# Patient Record
Sex: Male | Born: 1990 | Hispanic: No | Marital: Single | Smoking: Current every day smoker
Health system: Southern US, Community
[De-identification: ages and names within clinical notes are randomized; demographics above are authoritative.]

---

## 2020-09-01 ENCOUNTER — Other Ambulatory Visit: Payer: Self-pay

## 2020-09-01 ENCOUNTER — Encounter (HOSPITAL_COMMUNITY): Payer: Self-pay

## 2020-09-01 ENCOUNTER — Ambulatory Visit (HOSPITAL_COMMUNITY)
Admission: EM | Admit: 2020-09-01 | Discharge: 2020-09-01 | Disposition: A | Discharge status: Home / Self Care | Payer: Medicare Other | Attending: Family Medicine | Admitting: Family Medicine

## 2020-09-01 DIAGNOSIS — R067 Sneezing: Secondary | ICD-10-CM | POA: Insufficient documentation

## 2020-09-01 DIAGNOSIS — B349 Viral infection, unspecified: Secondary | ICD-10-CM | POA: Insufficient documentation

## 2020-09-01 DIAGNOSIS — R059 Cough, unspecified: Secondary | ICD-10-CM | POA: Diagnosis not present

## 2020-09-01 DIAGNOSIS — Z1152 Encounter for screening for COVID-19: Secondary | ICD-10-CM | POA: Insufficient documentation

## 2020-09-01 LAB — SARS CORONAVIRUS 2 (TAT 6-24 HRS): SARS Coronavirus 2: NEGATIVE

## 2020-09-01 MED ORDER — CETIRIZINE-PSEUDOEPHEDRINE ER 5-120 MG PO TB12: 1.0000 | ORAL_TABLET | Freq: Every day | ORAL | 0 refills | Status: AC

## 2020-09-01 MED ORDER — FLUTICASONE PROPIONATE 50 MCG/ACT NA SUSP: 2.0000 | Freq: Every day | NASAL | 0 refills | Status: AC

## 2020-09-01 NOTE — Discharge Instructions (Addendum)
Use zyrtec D for congestion and sneezing  Use 2 sprays of flonase per nostril twice a day  Your COVID test is pending.  You should self quarantine until the test result is back.    Take Tylenol as needed for fever or discomfort.  Rest and keep yourself hydrated.    Go to the emergency department if you develop acute worsening symptoms.

## 2020-09-01 NOTE — ED Triage Notes (Signed)
Pt presents with non productive cough and sneezing today.

## 2020-09-03 NOTE — ED Provider Notes (Signed)
[facility]   [id number] 09/01/20 Arrival Time: 1740   CC: COVID symptoms  SUBJECTIVE: History from: patient.  Jeremy Graves is a 29 y.o. male who presents with abrupt onset of nasal congestion, PND, sneezing and cough today. Denies sick exposure to COVID, flu or strep. Denies recent travel. Has negative history of Covid. Has not completed Covid vaccines. Has not taken OTC medications for this. There are no aggravating or alleviating factors. Denies previous symptoms in the past. Denies fever, chills, fatigue, sinus pain, rhinorrhea, sore throat, SOB, wheezing, chest pain, nausea, changes in bowel or bladder habits.    ROS: As per HPI.  All other pertinent ROS negative.     History reviewed. No pertinent past medical history. History reviewed. No pertinent surgical history. Allergies  Allergen Reactions   Latex    No current facility-administered medications on file prior to encounter.   No current outpatient medications on file prior to encounter.   Social History   Socioeconomic History   Marital status: Single    Spouse name: Not on file   Number of children: Not on file   Years of education: Not on file   Highest education level: Not on file  Occupational History   Not on file  Tobacco Use   Smoking status: Not on file  Substance and Sexual Activity   Alcohol use: Not on file   Drug use: Not on file   Sexual activity: Not on file  Other Topics Concern   Not on file  Social History Narrative   Not on file   Social Determinants of Health   Financial Resource Strain:    Difficulty of Paying Living Expenses: Not on file  Food Insecurity:    Worried About Running Out of Food in the Last Year: Not on file   Ran Out of Food in the Last Year: Not on file  Transportation Needs:    Lack of Transportation (Medical): Not on file   Lack of Transportation (Non-Medical): Not on file  Physical Activity:    Days of Exercise per Week: Not on file     Minutes of Exercise per Session: Not on file  Stress:    Feeling of Stress : Not on file  Social Connections:    Frequency of Communication with Friends and Family: Not on file   Frequency of Social Gatherings with Friends and Family: Not on file   Attends Religious Services: Not on file   Active Member of Clubs or Organizations: Not on file   Attends Banker Meetings: Not on file   Marital Status: Not on file  Intimate Partner Violence:    Fear of Current or Ex-Partner: Not on file   Emotionally Abused: Not on file   Physically Abused: Not on file   Sexually Abused: Not on file   Family History  Family history unknown: Yes    OBJECTIVE:  Vitals:   09/01/20 1805  BP: 118/70  Pulse: 83  Resp: 18  Temp: 98.9 F (37.2 C)  TempSrc: Oral  SpO2: 99%     General appearance: alert; appears fatigued, but nontoxic; speaking in full sentences and tolerating own secretions HEENT: NCAT; Ears: EACs clear, TMs pearly gray; Eyes: PERRL.  EOM grossly intact. Sinuses: nontender; Nose: nares patent without rhinorrhea, Throat: oropharynx clear, tonsils non erythematous or enlarged, uvula midline  Neck: supple without LAD Lungs: unlabored respirations, symmetrical air entry; cough: absent; no respiratory distress; CTAB Heart: regular rate and rhythm.  Radial pulses 2+  symmetrical bilaterally Skin: warm and dry Psychological: alert and cooperative; normal mood and affect  LABS:  No results found for this or any previous visit (from the past 24 hour(s)).   ASSESSMENT & PLAN:  1. Cough   2. Sneezing   3. Viral illness   4. Encounter for screening for COVID-19     Meds ordered this encounter  Medications   cetirizine-pseudoephedrine (ZYRTEC-D) 5-120 MG tablet    Sig: Take 1 tablet by mouth daily.    Dispense:  30 tablet    Refill:  0    Order Specific Question:   Supervising Provider    Answer:   Merrilee Jansky [[id number]]   fluticasone (FLONASE) 50  MCG/ACT nasal spray    Sig: Place 2 sprays into both nostrils daily.    Dispense:  9.9 mL    Refill:  0    Order Specific Question:   Supervising Provider    Answer:   Merrilee Jansky [id number]   Prescribed flonase Prescribed zyrtec D   COVID testing ordered.  It will take between 1-2 days for test results.  Someone will contact you regarding abnormal results.    Patient should remain in quarantine until they have received Covid results.  If negative you may resume normal activities (go back to work/school) while practicing hand hygiene, social distance, and mask wearing.  If positive, patient should remain in quarantine for 10 days from symptom onset AND greater than 72 hours after symptoms resolution (absence of fever without the use of fever-reducing medication and improvement in respiratory symptoms), whichever is longer Get plenty of rest and push fluids Use medications daily for symptom relief Use OTC medications like ibuprofen or tylenol as needed fever or pain Call or go to the ED if you have any new or worsening symptoms such as fever, worsening cough, shortness of breath, chest tightness, chest pain, turning blue, changes in mental status.  Reviewed expectations re: course of current medical issues. Questions answered. Outlined signs and symptoms indicating need for more acute intervention. Patient verbalized understanding. After Visit Summary given.         Moshe Cipro, NP 09/03/20 1152

## 2020-09-20 ENCOUNTER — Ambulatory Visit (INDEPENDENT_AMBULATORY_CARE_PROVIDER_SITE_OTHER): Payer: Medicare Other

## 2020-09-20 ENCOUNTER — Encounter (HOSPITAL_COMMUNITY): Payer: Self-pay | Admitting: *Deleted

## 2020-09-20 ENCOUNTER — Ambulatory Visit (HOSPITAL_COMMUNITY)
Admission: EM | Admit: 2020-09-20 | Discharge: 2020-09-20 | Disposition: A | Discharge status: Home / Self Care | Payer: Medicare Other | Attending: Family Medicine | Admitting: Family Medicine

## 2020-09-20 ENCOUNTER — Other Ambulatory Visit: Payer: Self-pay

## 2020-09-20 DIAGNOSIS — S9031XA Contusion of right foot, initial encounter: Secondary | ICD-10-CM

## 2020-09-20 DIAGNOSIS — S99921A Unspecified injury of right foot, initial encounter: Secondary | ICD-10-CM

## 2020-09-20 MED ORDER — IBUPROFEN 800 MG PO TABS: 800.0000 mg | ORAL_TABLET | Freq: Three times a day (TID) | ORAL | 0 refills | Status: AC

## 2020-09-20 NOTE — ED Triage Notes (Signed)
PT reports a Pallet jack fell on his RT foot . Pt has sweling RT great toe . Pt reports the pallet jack hit his first 4 toes.

## 2020-09-20 NOTE — Discharge Instructions (Signed)
Xray normal Use anti-inflammatories for pain/swelling. You may take up to 800 mg Ibuprofen every 8 hours with food. You may supplement Ibuprofen with Tylenol 500-1000 mg every 8 hours.  Ice and elevate Follow up if not improving 

## 2020-09-20 NOTE — ED Provider Notes (Signed)
[facility]    CSN: [id number] Arrival date & time: 09/20/20  1238      History   Chief Complaint Chief Complaint  Patient presents with  . Foot Injury    HPI Jeremy Graves is a 29 y.o. male presenting today for evaluation of right foot injury.  Patient reports a pallet jack and some pallets fell on his right foot earlier today at work.  He since has developed pain especially around his great toe area.  He does report prior injuries, but unsure of prior fractures to foot.  Denies numbness or tingling.  Denies any ankle or lower leg pain.  HPI  History reviewed. No pertinent past medical history.  There are no problems to display for this patient.   History reviewed. No pertinent surgical history.     Home Medications    Prior to Admission medications   Medication Sig Start Date End Date Taking? Authorizing Provider  cetirizine-pseudoephedrine (ZYRTEC-D) 5-120 MG tablet Take 1 tablet by mouth daily. 09/01/20  Yes Moshe Cipro, NP  fluticasone (FLONASE) 50 MCG/ACT nasal spray Place 2 sprays into both nostrils daily. 09/01/20   Moshe Cipro, NP  ibuprofen (ADVIL) 800 MG tablet Take 1 tablet (800 mg total) by mouth 3 (three) times daily. 09/20/20   Daryll Spisak, Junius Creamer, PA-C    Family History Family History  Family history unknown: Yes    Social History Social History   Tobacco Use  . Smoking status: Current Every Day Smoker    Types: Cigarettes  . Smokeless tobacco: Never Used  Substance Use Topics  . Alcohol use: Not Currently  . Drug use: Not Currently     Allergies   Latex   Review of Systems Review of Systems  Constitutional: Negative for fatigue and fever.  Eyes: Negative for redness, itching and visual disturbance.  Respiratory: Negative for shortness of breath.   Cardiovascular: Negative for chest pain and leg swelling.  Gastrointestinal: Negative for nausea and vomiting.  Musculoskeletal: Positive for arthralgias and joint  swelling. Negative for myalgias.  Skin: Positive for wound. Negative for color change and rash.  Neurological: Negative for dizziness, syncope, weakness, light-headedness and headaches.     Physical Exam Triage Vital Signs ED Triage Vitals  Enc Vitals Group     BP 09/20/20 1457 119/79     Pulse Rate 09/20/20 1457 73     Resp 09/20/20 1457 16     Temp 09/20/20 1457 97.8 F (36.6 C)     Temp Source 09/20/20 1457 Oral     SpO2 09/20/20 1457 100 %     Weight 09/20/20 1459 135 lb (61.2 kg)     Height 09/20/20 1459 5\' 4"  (1.626 m)     Head Circumference --      Peak Flow --      Pain Score 09/20/20 1458 8     Pain Loc --      Pain Edu? --      Excl. in GC? --    No data found.  Updated Vital Signs BP 119/79 (BP Location: Right Arm)   Pulse 73   Temp 97.8 F (36.6 C) (Oral)   Resp 16   Ht 5\' 4"  (1.626 m)   Wt 135 lb (61.2 kg)   SpO2 100%   BMI 23.17 kg/m   Visual Acuity Right Eye Distance:   Left Eye Distance:   Bilateral Distance:    Right Eye Near:   Left Eye Near:    Bilateral Near:  Physical Exam Vitals and nursing note reviewed.  Constitutional:      Appearance: He is well-developed.     Comments: No acute distress  HENT:     Head: Normocephalic and atraumatic.     Nose: Nose normal.  Eyes:     Conjunctiva/sclera: Conjunctivae normal.  Cardiovascular:     Rate and Rhythm: Normal rate.  Pulmonary:     Effort: Pulmonary effort is normal. No respiratory distress.  Abdominal:     General: There is no distension.  Musculoskeletal:        General: Normal range of motion.     Cervical back: Neck supple.     Comments: Right foot: Superficial abrasion noted to distal great toe, tenderness to palpation around this area throughout proximal distal phalanx of toe and at distal first metatarsal  No obvious swelling deformity noted throughout dorsum of foot or ankle, nontender throughout third through fifth metatarsals or bilateral malleoli Dorsalis pedis 2+    Skin:    General: Skin is warm and dry.  Neurological:     Mental Status: He is alert and oriented to person, place, and time.      UC Treatments / Results  Labs (all labs ordered are listed, but only abnormal results are displayed) Labs Reviewed - No data to display  EKG   Radiology DG Foot Complete Right  Result Date: 09/20/2020 CLINICAL DATA:  Right foot injury. EXAM: RIGHT FOOT COMPLETE - 3+ VIEW COMPARISON:  No prior. FINDINGS: No acute bony or joint abnormality identified. No evidence of fracture. No evidence of dislocation. No radiopaque foreign body. IMPRESSION: No acute abnormality. Electronically Signed   By: Maisie Fus  Register   On: 09/20/2020 15:29    Procedures Procedures (including critical care time)  Medications Ordered in UC Medications - No data to display  Initial Impression / Assessment and Plan / UC Course  I have reviewed the triage vital signs and the nursing notes.  Pertinent labs & imaging results that were available during my care of the patient were reviewed by me and considered in my medical decision making (see chart for details).    X-ray negative for acute bony abnormality, treating as foot contusion, rest ice elevation and anti-inflammatories.  Monitor for gradual resolution.  Weight-bear as tolerated.  Discussed strict return precautions. Patient verbalized understanding and is agreeable with plan.   Final Clinical Impressions(s) / UC Diagnoses   Final diagnoses:  Contusion of right foot, initial encounter     Discharge Instructions     Xray normal Use anti-inflammatories for pain/swelling. You may take up to 800 mg Ibuprofen every 8 hours with food. You may supplement Ibuprofen with Tylenol [phone] mg every 8 hours.  Ice and elevate Follow up if not improving    ED Prescriptions    Medication Sig Dispense Auth. Provider   ibuprofen (ADVIL) 800 MG tablet Take 1 tablet (800 mg total) by mouth 3 (three) times daily. 21 tablet  Brenner Visconti, [place] C, PA-C     PDMP not reviewed this encounter.   Lew Dawes, [place] 09/20/20 1546

## 2021-02-28 IMAGING — DX DG FOOT COMPLETE 3+V*R*
3 series · 3 of 3 positions shown · non-contrast
Comparison: No prior.

CLINICAL DATA: Right foot injury.

EXAM:
RIGHT FOOT COMPLETE - 3+ VIEW

[foot ap]
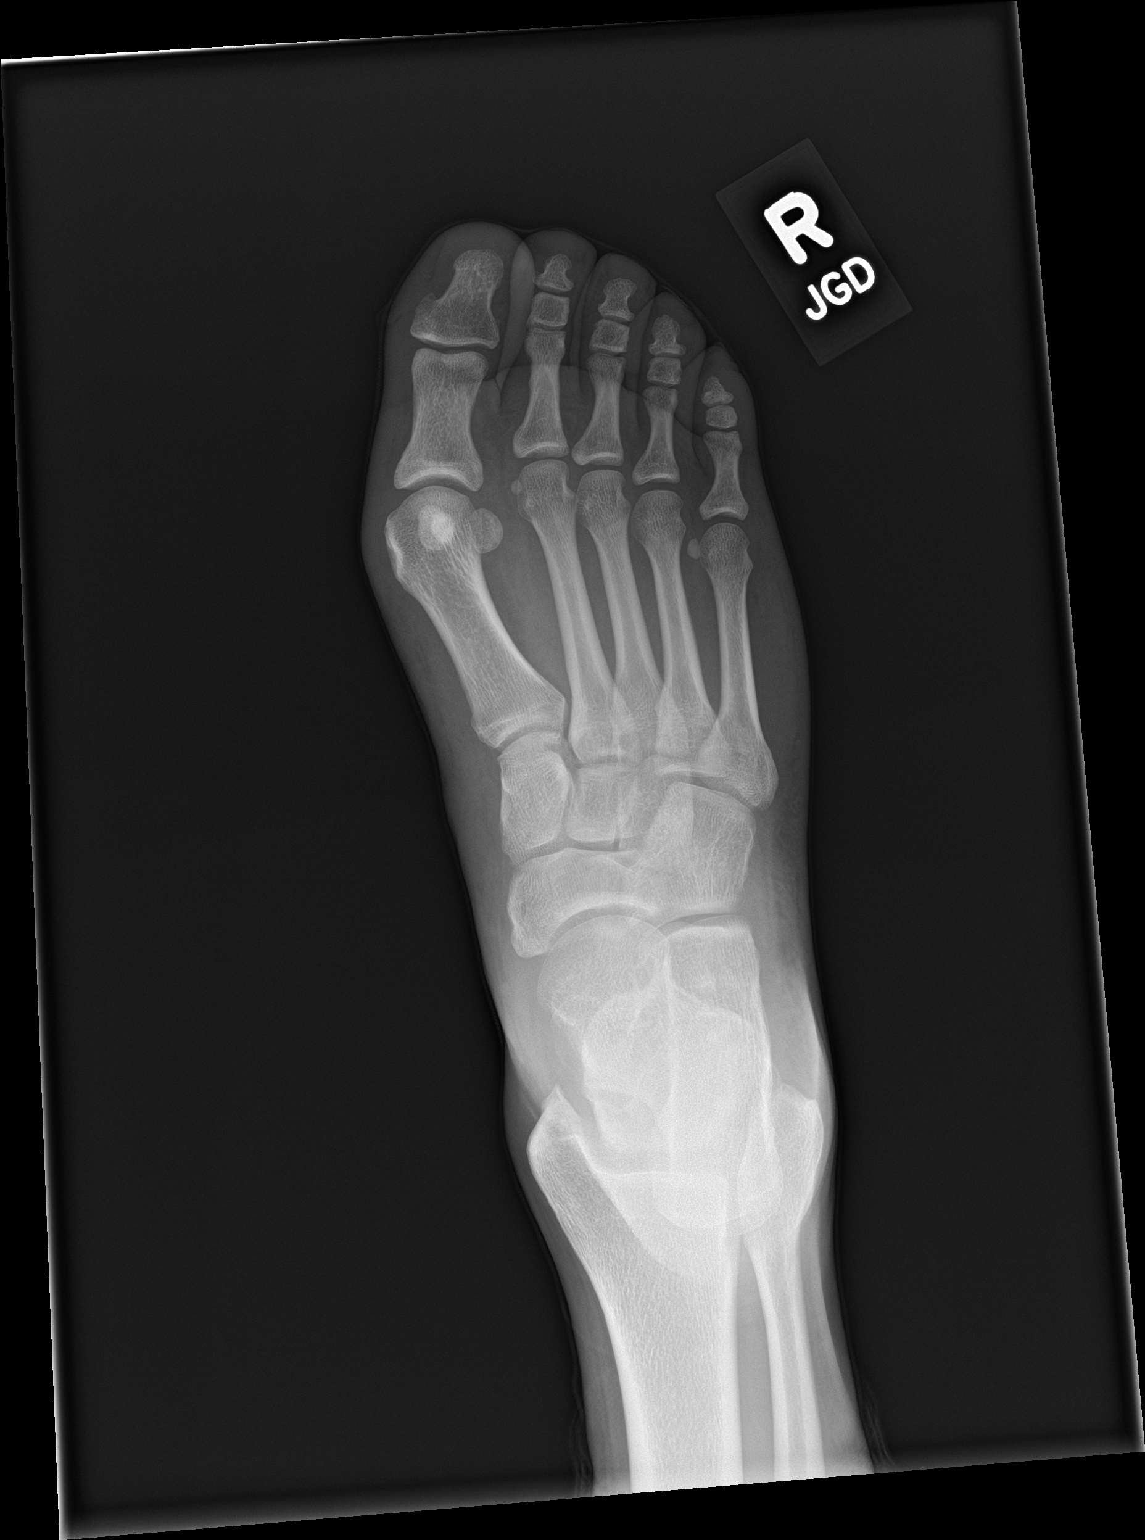

[foot obl]
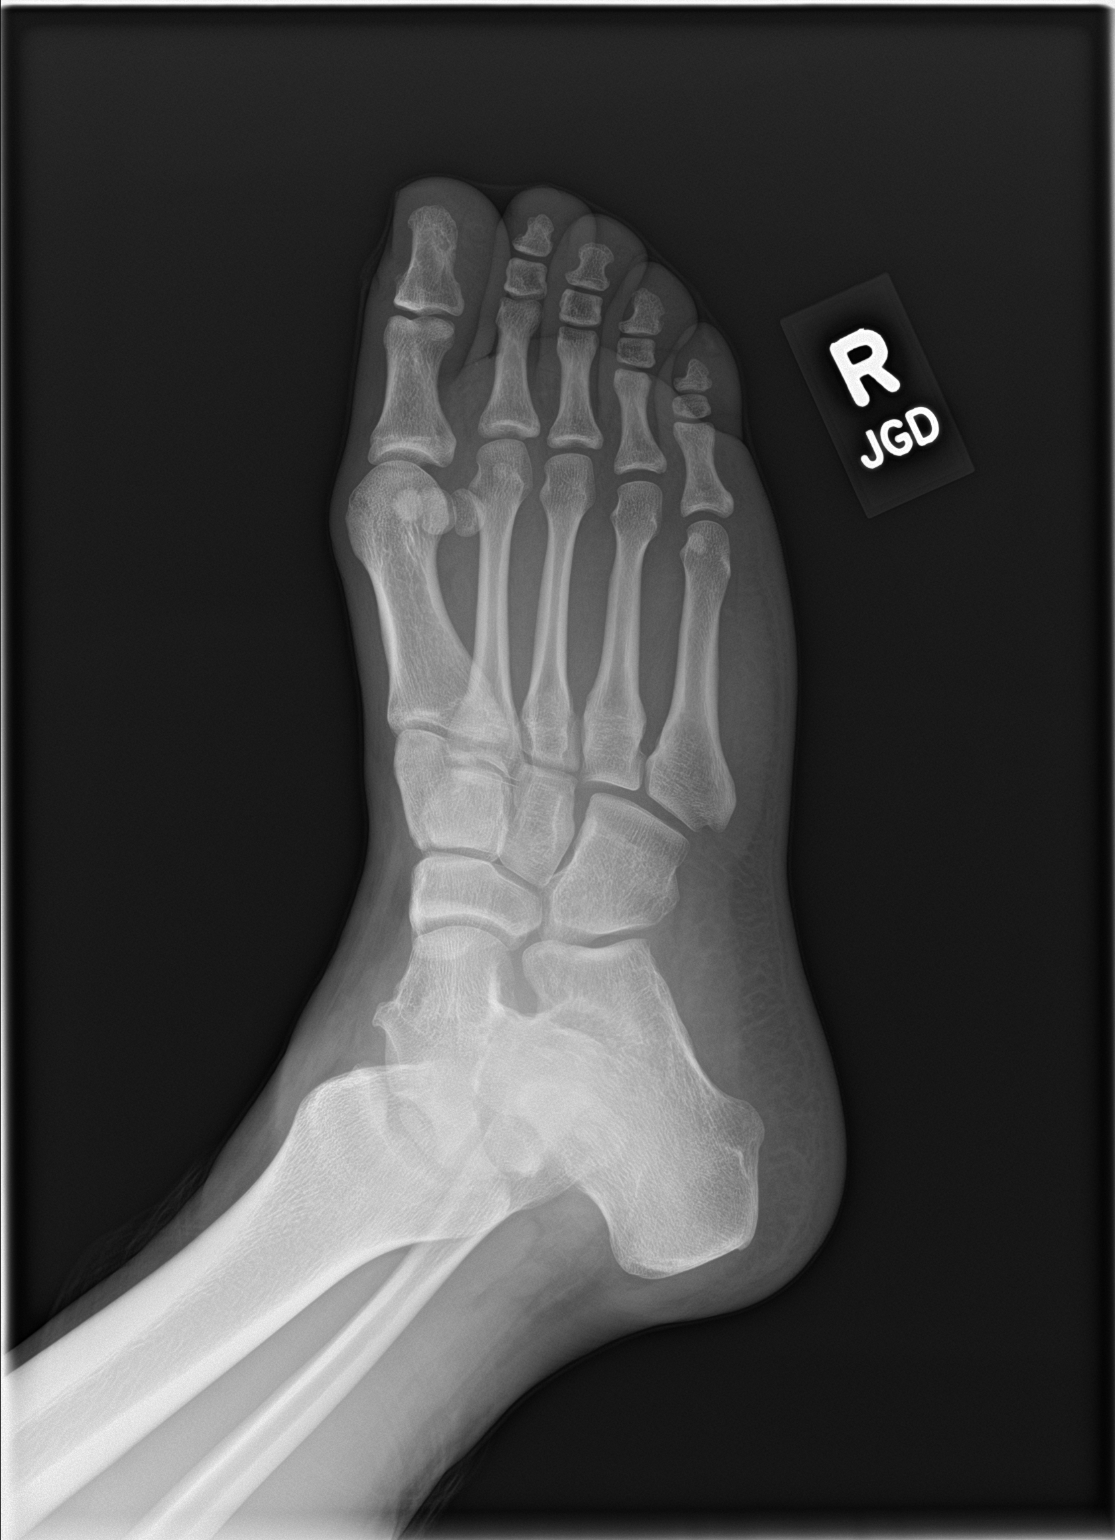

[foot lat]
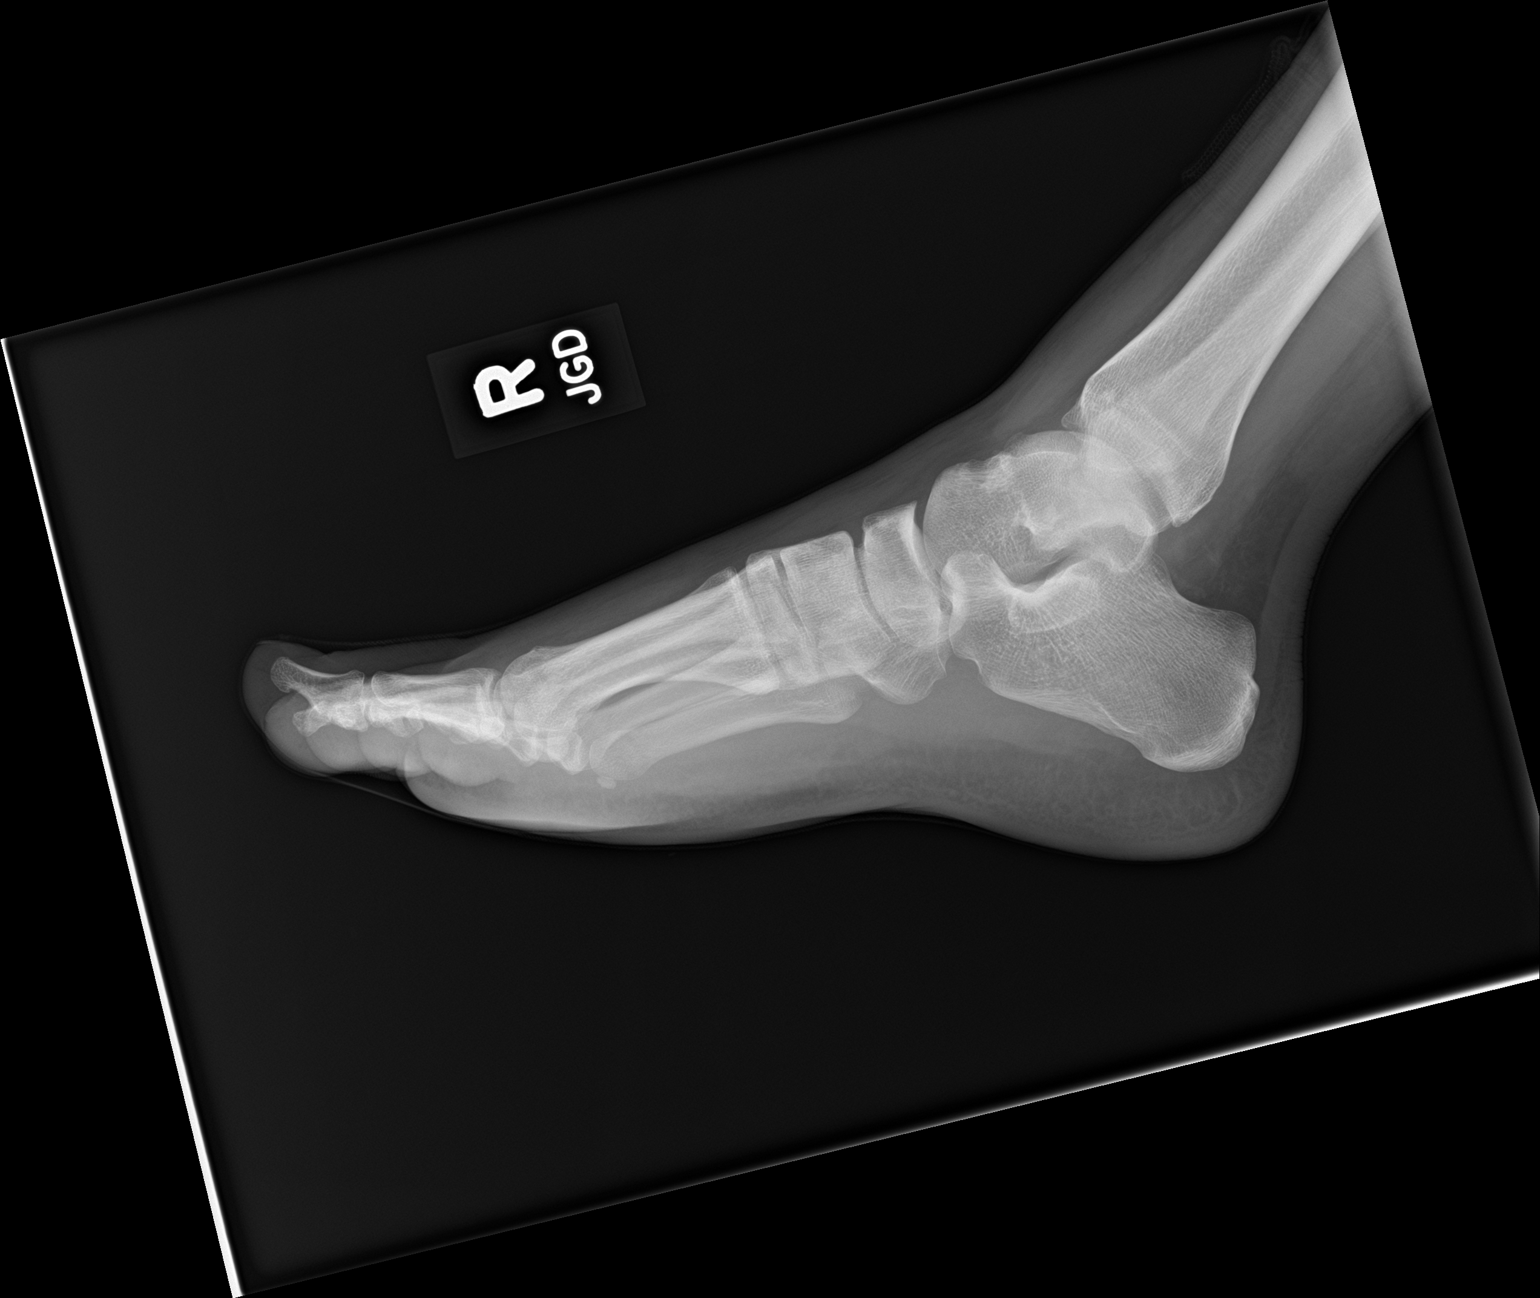

[3 of 3 positions shown; findings below may reference images not displayed]

FINDINGS: No acute bony or joint abnormality identified. No evidence of
fracture. No evidence of dislocation. No radiopaque foreign body.
IMPRESSION: No acute abnormality.
